# Patient Record
Sex: Female | Born: 1955 | Hispanic: No | Marital: Single | Smoking: Former smoker
Health system: Southern US, Community
[De-identification: ages and names within clinical notes are randomized; demographics above are authoritative.]

## PROBLEM LIST (undated history)

## (undated) HISTORY — PX: TONSILLECTOMY: SUR1361

---

## 2004-02-02 ENCOUNTER — Ambulatory Visit: Payer: Self-pay | Admitting: Internal Medicine

## 2009-09-18 ENCOUNTER — Encounter (INDEPENDENT_AMBULATORY_CARE_PROVIDER_SITE_OTHER): Payer: Self-pay | Admitting: *Deleted

## 2009-09-19 ENCOUNTER — Ambulatory Visit: Payer: Self-pay | Admitting: Internal Medicine

## 2009-10-03 ENCOUNTER — Ambulatory Visit: Payer: Self-pay | Admitting: Internal Medicine

## 2009-10-06 ENCOUNTER — Encounter: Payer: Self-pay | Admitting: Internal Medicine

## 2010-04-03 NOTE — Miscellaneous (Signed)
Summary: DIR COL SCR-AGE...EM  Clinical Lists Changes  Medications: Added new medication of MOVIPREP 100 GM  SOLR (PEG-KCL-NACL-NASULF-NA ASC-C) As per prep instructions. - Signed Rx of MOVIPREP 100 GM  SOLR (PEG-KCL-NACL-NASULF-NA ASC-C) As per prep instructions.;  #1 x 0;  Signed;  Entered by: Sherren Kerns RN;  Authorized by: Iva Boop MD, FACG;  Method used: Print then Give to Patient Allergies: Added new allergy or adverse reaction of SULFA Added new allergy or adverse reaction of PREDNISONE Observations: Added new observation of NKA: F (09/19/2009 13:39)    Prescriptions: MOVIPREP 100 GM  SOLR (PEG-KCL-NACL-NASULF-NA ASC-C) As per prep instructions.  #1 x 0   Entered by:   Sherren Kerns RN   Authorized by:   Iva Boop MD, [facility]   Signed by:   Sherren Kerns RN on 09/19/2009   Method used:   Print then Give to Patient   RxID:   [id number]

## 2010-04-03 NOTE — Letter (Signed)
Summary: Patient Notice-Hyperplastic Polyps  [facility]  [address] [place], [place] [address]   Phone: [phone]  Fax: [phone]        October 06, 2009 MRN: [id number]    [facility] [address] [place], [place]  [address]    Dear Ms. Prabhu,  I am pleased to inform you that the colon polyp removed during your recent colonoscopy was hyperplastic.  These types of polyps are NOT pre-cancerous.  It is therefore my recommendation that you have a repeat colonoscopy examination in 10 years for routine colorectal cancer screening.  Should you develop new or worsening symptoms of abdominal pain, bowel habit changes or bleeding from the rectum or bowels, please schedule an evaluation with either your primary care physician or with me.  Please call us if you are having persistent problems or have questions about your condition that have not been fully answered at this time.  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  Sincerely,  Iva Boop MD, [facility] This letter has been electronically signed by your physician.  Appended Document: Patient Notice-Hyperplastic Polyps letter mailed

## 2010-04-03 NOTE — Procedures (Signed)
Summary: Colonoscopy  Patient: Alexis Santiago Note: All result statuses are Final unless otherwise noted.  Tests: (1) Colonoscopy (COL)   COL Colonoscopy           DONE     [facility]     [address]     [place], [place]  [address]           COLONOSCOPY PROCEDURE REPORT           PATIENT:  [name], [name]  MR#:  [id number]     BIRTHDATE:  18-Oct-1955, 53 yrs. old  GENDER:  female     ENDOSCOPIST:  Iva Boop, MD, [facility]     REF. BY:  Rudi Heap, M.D.     PROCEDURE DATE:  10/03/2009     PROCEDURE:  Colonoscopy with snare polypectomy     ASA CLASS:  Class II     INDICATIONS:  Routine Risk Screening     MEDICATIONS:   Fentanyl 75 mcg IV, Versed 9 mg IV           DESCRIPTION OF PROCEDURE:   After the risks benefits and     alternatives of the procedure were thoroughly explained, informed     consent was obtained.  Digital rectal exam was performed and     revealed no abnormalities.   The LB CF-H180AL [id number] endoscope     was introduced through the anus and advanced to the cecum, which     was identified by both the appendix and ileocecal valve, without     limitations.  The quality of the prep was excellent, using     MoviPrep.  The instrument was then slowly withdrawn as the colon     was fully examined. Insertion: 2:20 minutes Withdrawal: 6:53     minutes     <<PROCEDUREIMAGES>>           FINDINGS:  A sessile polyp was found at the hepatic flexure. It     was 6 mm in size. Polyp was snared without cautery. Retrieval was     successful.  This was otherwise a normal examination of the colon     including retroflexed views in the right colon.   Retroflexed     views in the rectum revealed internal and external hemorrhoids.     The scope was then withdrawn from the patient and the procedure     completed.           COMPLICATIONS:  None     ENDOSCOPIC IMPRESSION:     1) 6 mm sessile polyp at the hepatic flexure - removed     2) Internal and external hemorrhoids     3)  Otherwise normal examination, excellent prep           REPEAT EXAM:  In for Colonoscopy, pending biopsy results.           Iva Boop, MD, [facility]           CC:  Rudi Heap, MD     The Patient           n.     Rosalie Doctor:   Iva Boop at 10/03/2009 03:53 PM           Alexis Santiago, [id number]  Note: An exclamation mark (!) indicates a result that was not dispersed into the flowsheet. Document Creation Date: 10/03/2009 3:53 PM _______________________________________________________________________  (1) Order result status: Final Collection or observation date-time: 10/03/2009 15:42 Requested date-time:  Receipt date-time:  Reported date-time:  Referring Physician:   Ordering Physician: Stan Head [id number]) Specimen Source:  Source: Launa Grill Order Number: [id number] Lab site:   Appended Document: Colonoscopy     Procedures Next Due Date:    Colonoscopy: 10/2019

## 2010-04-03 NOTE — Letter (Signed)
Summary: [facility] Instructions  [facility]  [address] [place], [place] [address]   Phone: [phone]  Fax: [phone]       Alexis Santiago    Jan 07, 1956    MRN: [id number]        Procedure Day /Date:  Tuesday 10/03/2009     Arrival Time: 2:00 pm      Procedure Time: 3:00 pm     Location of Procedure:                    _x _  [facility] (4th Floor)                        PREPARATION FOR COLONOSCOPY WITH MOVIPREP   Starting 5 days prior to your procedure Friday 7/28 do not eat nuts, seeds, popcorn, corn, beans, peas,  salads, or any raw vegetables.  Do not take any fiber supplements (e.g. Metamucil, Citrucel, and Benefiber).  THE DAY BEFORE YOUR PROCEDURE         DATE: Monday 8/1  1.  Drink clear liquids the entire day-NO SOLID FOOD  2.  Do not drink anything colored red or purple.  Avoid juices with pulp.  No orange juice.  3.  Drink at least 64 oz. (8 glasses) of fluid/clear liquids during the day to prevent dehydration and help the prep work efficiently.  CLEAR LIQUIDS INCLUDE: Water Jello Ice Popsicles Tea (sugar ok, no milk/cream) Powdered fruit flavored drinks Coffee (sugar ok, no milk/cream) Gatorade Juice: apple, white grape, white cranberry  Lemonade Clear bullion, consomm, broth Carbonated beverages (any kind) Strained chicken noodle soup Hard Candy                             4.  In the morning, mix first dose of MoviPrep solution:    Empty 1 Pouch A and 1 Pouch B into the disposable container    Add lukewarm drinking water to the top line of the container. Mix to dissolve    Refrigerate (mixed solution should be used within 24 hrs)  5.  Begin drinking the prep at 5:00 p.m. The MoviPrep container is divided by 4 marks.   Every 15 minutes drink the solution down to the next mark (approximately 8 oz) until the full liter is complete.   6.  Follow completed prep with 16 oz of clear liquid of your choice (Nothing red or  purple).  Continue to drink clear liquids until bedtime.  7.  Before going to bed, mix second dose of MoviPrep solution:    Empty 1 Pouch A and 1 Pouch B into the disposable container    Add lukewarm drinking water to the top line of the container. Mix to dissolve    Refrigerate  THE DAY OF YOUR PROCEDURE      DATE: Tuesday 8/2  Beginning at 10:00 a.m. (5 hours before procedure):         1. Every 15 minutes, drink the solution down to the next mark (approx 8 oz) until the full liter is complete.  2. Follow completed prep with 16 oz. of clear liquid of your choice.    3. You may drink clear liquids until 1:00 pm (2 HOURS BEFORE PROCEDURE).   MEDICATION INSTRUCTIONS  Unless otherwise instructed, you should take regular prescription medications with a small sip of water   as early as possible the morning of  your procedure.    Additional medication instructions: n/a         OTHER INSTRUCTIONS  You will need a responsible adult at least 53 years of age to accompany you and drive you home.   This person must remain in the waiting room during your procedure.  Wear loose fitting clothing that is easily removed.  Leave jewelry and other valuables at home.  However, you may wish to bring a book to read or  an iPod/MP3 player to listen to music as you wait for your procedure to start.  Remove all body piercing jewelry and leave at home.  Total time from sign-in until discharge is approximately 2-3 hours.  You should go home directly after your procedure and rest.  You can resume normal activities the  day after your procedure.  The day of your procedure you should not:   Drive   Make legal decisions   Operate machinery   Drink alcohol   Return to work  You will receive specific instructions about eating, activities and medications before you leave.    The above instructions have been reviewed and explained to me by  Sherren Kerns RN  September 19, 2009 2:07 PM       I fully understand and can verbalize these instructions _____________________________ Date _________

## 2012-07-15 ENCOUNTER — Ambulatory Visit (INDEPENDENT_AMBULATORY_CARE_PROVIDER_SITE_OTHER): Payer: BC Managed Care – PPO

## 2012-07-15 ENCOUNTER — Ambulatory Visit (INDEPENDENT_AMBULATORY_CARE_PROVIDER_SITE_OTHER): Payer: BC Managed Care – PPO | Admitting: Family Medicine

## 2012-07-15 ENCOUNTER — Encounter: Payer: Self-pay | Admitting: Family Medicine

## 2012-07-15 VITALS — BP 148/72 | HR 73 | Temp 97.8°F | Ht 66.0 in | Wt 255.6 lb

## 2012-07-15 DIAGNOSIS — R05 Cough: Secondary | ICD-10-CM

## 2012-07-15 DIAGNOSIS — R059 Cough, unspecified: Secondary | ICD-10-CM

## 2012-07-15 LAB — POCT CBC
HCT, POC: 40.6 % (ref 37.7–47.9)
Hemoglobin: 12.8 g/dL (ref 12.2–16.2)
MCHC: 31.5 g/dL — AB (ref 31.8–35.4)
POC Granulocyte: 5.3 (ref 2–6.9)

## 2012-07-15 MED ORDER — BUDESONIDE-FORMOTEROL FUMARATE 160-4.5 MCG/ACT IN AERO: 2.0000 | INHALATION_SPRAY | Freq: Two times a day (BID) | RESPIRATORY_TRACT | Status: DC

## 2012-07-15 MED ORDER — METHYLPREDNISOLONE ACETATE 80 MG/ML IJ SUSP
60.0000 mg | Freq: Once | INTRAMUSCULAR | Status: AC
  Administered 2012-07-15: 60 mg via INTRAMUSCULAR

## 2012-07-15 MED ORDER — FLUTICASONE PROPIONATE 50 MCG/ACT NA SUSP: 2.0000 | Freq: Every day | NASAL | Status: DC

## 2012-07-15 NOTE — Progress Notes (Signed)
  Subjective:    Patient ID: Alexis Santiago, female    DOB: 15-Jun-1955, 57 y.o.   MRN: [id number]  HPI Patient has noticed this off and on cough for the past 2 months. It's not productive and in the sputum that is produced is thick and white in color. She has noticed occasional wheezing. The cough and wheezing experiences completely new to her, sometimes to the point of losing her breath completely. There has been no fever or or head congestion. 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   Review of Systems  Constitutional: Negative for fever.  Respiratory: Positive for cough (persistent, started in March).        Objective:   Physical Exam  Vitals reviewed. Constitutional: She is oriented to person, place, and time. She appears well-developed and well-nourished. She appears distressed.  HENT:  Head: Normocephalic and atraumatic.  Right Ear: External ear normal.  Left Ear: External ear normal.  Mouth/Throat: Oropharynx is clear and moist. No oropharyngeal exudate.  Eyes: Conjunctivae are normal. Right eye exhibits no discharge. Left eye exhibits no discharge.  Neck: Normal range of motion. Neck supple. No thyromegaly present.  Cardiovascular: Normal rate and regular rhythm.  Exam reveals no gallop and no friction rub.   No murmur heard. 72 per minute  Pulmonary/Chest: Effort normal and breath sounds normal. No respiratory distress. She has no wheezes. She has no rales.  Dry cough  Neurological: She is alert and oriented to person, place, and time.  Skin: Skin is warm and dry. No rash noted. No erythema.  Psychiatric: She has a normal mood and affect. Her behavior is normal. Judgment and thought content normal.    [facility] reading (PRIMARY) by  Dr. Christell Constant: CXR:  Within normal limits                                       Assessment & Plan:  1. Cough - DG Chest 2 View - POCT CBC  2. Symbicort 160/4.5   2 puffs twice daily, rinse mouth after using     Flonase 1-2  sprays each nostril at bedtime     Depo-Medrol 60 mg IM   Patient Instructions  Drink lots of fluid Take and use medication as directed Mucinex maximum strength, blue and white in color, 1 twice daily with a large glass of water

## 2012-07-15 NOTE — Patient Instructions (Addendum)
Drink lots of fluid Take and use medication as directed Mucinex maximum strength, blue and white in color, 1 twice daily with a large glass of water

## 2012-07-20 ENCOUNTER — Telehealth: Payer: Self-pay | Admitting: Family Medicine

## 2012-07-20 NOTE — Telephone Encounter (Signed)
Please advise 

## 2012-08-13 ENCOUNTER — Ambulatory Visit (INDEPENDENT_AMBULATORY_CARE_PROVIDER_SITE_OTHER): Payer: BC Managed Care – PPO | Admitting: Family Medicine

## 2012-08-13 ENCOUNTER — Encounter: Payer: Self-pay | Admitting: Family Medicine

## 2012-08-13 VITALS — BP 144/80 | HR 87 | Temp 97.8°F | Wt 260.4 lb

## 2012-08-13 DIAGNOSIS — L723 Sebaceous cyst: Secondary | ICD-10-CM

## 2012-08-13 DIAGNOSIS — L720 Epidermal cyst: Secondary | ICD-10-CM

## 2012-08-13 DIAGNOSIS — J209 Acute bronchitis, unspecified: Secondary | ICD-10-CM

## 2012-08-13 DIAGNOSIS — J309 Allergic rhinitis, unspecified: Secondary | ICD-10-CM

## 2012-08-13 NOTE — Patient Instructions (Signed)
Use Mucinex twice daily as needed for cough. Drink plenty of fluids they're not irritating to the esophagus. Really ty to use the Flonase nose spray on a more regular basis. If didn't with reflux issues use Prevacid or Prilosec once daily before breakfast or Zantac one twice daily before breakfast and supper. He may try this regimen for a couple weeks and see if it helps any of your cough and reflux problems. An appointment for one provides to see about the epidermal cyst on his scalp appear

## 2012-08-13 NOTE — Progress Notes (Signed)
  Subjective:    Patient ID: Alexis Santiago, female    DOB: [birth year], 57 y.o.   MRN: [id number]  HPI  Patient returns to clinic for followup of cough and bronchospasm. He is doing better and is not using the inhaler or the Flonase. She still has minimal cough. She attributes some of her coughing possibly due to intake of beverages that it irritated her esophagus and to eating nutsthat were not cooked. X-rays and meds were reviewed from the previous visit.   Review of Systems  Respiratory: Positive for cough (improved, still slightly prod).        Objective:   Physical Exam  Vitals reviewed. Constitutional: She is oriented to person, place, and time. She appears well-developed and well-nourished.  HENT:  Head: Normocephalic and atraumatic.  Right Ear: External ear normal.  Left Ear: External ear normal.   nasal congestion bilaterally  Eyes: Conjunctivae are normal. Right eye exhibits no discharge. Left eye exhibits no discharge.  Neck: Normal range of motion. Neck supple. No thyromegaly present.  Cardiovascular: Normal rate and regular rhythm.   No murmur heard. Pulmonary/Chest: Effort normal and breath sounds normal. No respiratory distress. She has no wheezes. She has no rales.  Lymphadenopathy:    She has no cervical adenopathy.  Neurological: She is alert and oriented to person, place, and time.  Skin: Skin is warm and dry.  Large epididymal cysts superior occipital scalp  Psychiatric: She has a normal mood and affect. Judgment and thought content normal.          Assessment & Plan:  1. Acute bronchitis Use Mucinex as needed for cough   2. Allergic rhinitis -Use Flonase regularly  3. Epidermal cyst -Appointment with provider for excision  Patient Instructions  Use Mucinex twice daily as needed for cough. Drink plenty of fluids they're not irritating to the esophagus. Really ty to use the Flonase nose spray on a more regular basis. If didn't with reflux issues  use Prevacid or Prilosec once daily before breakfast or Zantac one twice daily before breakfast and supper. He may try this regimen for a couple weeks and see if it helps any of your cough and reflux problems. An appointment for one provides to see about the epidermal cyst on his scalp appear

## 2012-08-28 ENCOUNTER — Ambulatory Visit (INDEPENDENT_AMBULATORY_CARE_PROVIDER_SITE_OTHER): Payer: BC Managed Care – PPO | Admitting: Physician Assistant

## 2012-08-28 ENCOUNTER — Encounter: Payer: Self-pay | Admitting: Physician Assistant

## 2012-08-28 VITALS — BP 142/83 | HR 75 | Temp 98.0°F | Ht 66.0 in | Wt 254.0 lb

## 2012-08-28 DIAGNOSIS — L723 Sebaceous cyst: Secondary | ICD-10-CM

## 2012-08-28 NOTE — Progress Notes (Signed)
Subjective:     Patient ID: Alexis Santiago, female   DOB: 06-08-1955, 57 y.o.   MRN: [id number]  HPI Pt with painful cyst to the superior scalp She has had hx of same and prior excision of the same site mult times + increasing size and pain to the site Desires excision Denies any drainage from the site  Review of Systems  All other systems reviewed and are negative.       Objective:   Physical Exam  Nursing note and vitals reviewed. Pt with .3x.3 cm cystic are to the superior scalp Minimal TTP No drainage Offered excision- pt aware of possible reoccurrence Consent obtained Area cleansed with betadine Anesth with 1 cc lido with epi Under sterile techniq small incision made Cystic material removed Area cleansed again Packing placed  Dressing placed     Assessment:     1. Sebaceous cyst        Plan:    Wound care reviewed Progress packing daily Return immed if any S/S of infection

## 2012-08-28 NOTE — Patient Instructions (Signed)
Wound Care Wound care helps prevent pain and infection.  You may need a tetanus shot if:  You cannot remember when you had your last tetanus shot.  You have never had a tetanus shot.  The injury broke your skin. If you need a tetanus shot and you choose not to have one, you may get tetanus. Sickness from tetanus can be serious. HOME CARE   Only take medicine as told by your doctor.  Clean the wound daily with mild soap and water.  Change any bandages (dressings) as told by your doctor.  Put medicated cream and a bandage on the wound as told by your doctor.  Change the bandage if it gets wet, dirty, or starts to smell.  Take showers. Do not take baths, swim, or do anything that puts your wound under water.  Rest and raise (elevate) the wound until the pain and puffiness (swelling) are better.  Keep all doctor visits as told. GET HELP RIGHT AWAY IF:   Yellowish-white fluid (pus) comes from the wound.  Medicine does not lessen your pain.  There is a red streak going away from the wound.  You have a fever. MAKE SURE YOU:   Understand these instructions.  Will watch your condition.  Will get help right away if you are not doing well or get worse. Document Released: 11/28/2007 Document Revised: 05/13/2011 Document Reviewed: 06/24/2010 [employer] Patient Information 2014 [employer].  

## 2013-04-12 ENCOUNTER — Telehealth: Payer: Self-pay | Admitting: Family Medicine

## 2013-04-12 NOTE — Telephone Encounter (Signed)
appt with dwm at 2;30

## 2013-04-13 ENCOUNTER — Encounter: Payer: Self-pay | Admitting: Family Medicine

## 2013-04-13 ENCOUNTER — Ambulatory Visit (INDEPENDENT_AMBULATORY_CARE_PROVIDER_SITE_OTHER): Payer: BC Managed Care – PPO | Admitting: Family Medicine

## 2013-04-13 VITALS — BP 138/77 | HR 81 | Temp 97.6°F | Ht 66.0 in | Wt 250.0 lb

## 2013-04-13 DIAGNOSIS — H60549 Acute eczematoid otitis externa, unspecified ear: Secondary | ICD-10-CM

## 2013-04-13 DIAGNOSIS — L853 Xerosis cutis: Secondary | ICD-10-CM

## 2013-04-13 DIAGNOSIS — L738 Other specified follicular disorders: Secondary | ICD-10-CM

## 2013-04-13 DIAGNOSIS — H60509 Unspecified acute noninfective otitis externa, unspecified ear: Secondary | ICD-10-CM

## 2013-04-13 NOTE — Patient Instructions (Signed)
Shield the ear canals from the hair dryer Use a scent free shampoo Use cortisone 10 on a Q-tip to 3 times weekly in ear canal Try to keep as much water at of the ear canals as possible

## 2013-04-13 NOTE — Progress Notes (Signed)
   Subjective:    Patient ID: Alexis Santiago, female    DOB: Jul 07, 1955, 58 y.o.   MRN: [id number]  HPI Patient here today for ear itching and pain, right ear is worse. There is discharge and there are no other complaints       There are no active problems to display for this patient.  No outpatient encounter prescriptions on file as of 04/13/2013.    Review of Systems  Constitutional: Negative.   HENT: Positive for ear discharge and ear pain.   Eyes: Negative.   Respiratory: Negative.   Cardiovascular: Negative.   Gastrointestinal: Negative.   Endocrine: Negative.   Genitourinary: Negative.   Musculoskeletal: Negative.   Skin: Negative.   Allergic/Immunologic: Negative.   Neurological: Negative.   Hematological: Negative.   Psychiatric/Behavioral: Negative.        Objective:   Physical Exam  Nursing note and vitals reviewed. Constitutional: She is oriented to person, place, and time. She appears well-developed and well-nourished. No distress.  HENT:  Head: Normocephalic and atraumatic.  Right Ear: External ear normal.  Left Ear: External ear normal.  Nose: Nose normal.  Mouth/Throat: Oropharynx is clear and moist.  Specifically ear canals were normal and TMs were normal with no ear canal cellulitis or edema or erythema.  Eyes: Conjunctivae and EOM are normal. Pupils are equal, round, and reactive to light. Right eye exhibits no discharge. Left eye exhibits no discharge. No scleral icterus.  Neck: Normal range of motion. Neck supple. No thyromegaly present.  Cardiovascular: Normal rate, regular rhythm and normal heart sounds.   No murmur heard. Pulmonary/Chest: Effort normal and breath sounds normal. No respiratory distress. She has no rales. She exhibits no tenderness.  Musculoskeletal: Normal range of motion. She exhibits edema (right ankle).  Neurological: She is alert and oriented to person, place, and time.  Skin: Skin is warm and dry. No rash noted.  Very dry  skin on hands  Psychiatric: She has a normal mood and affect. Her behavior is normal. Judgment normal.   BP 138/77  Pulse 81  Temp(Src) 97.6 F (36.4 C) (Oral)  Ht 5\' 6"  (1.676 m)  Wt 250 lb (113.399 kg)  BMI 40.37 kg/m2        Assessment & Plan:  Dermatitis of ear canal  Dry skin  Patient Instructions  Shield the ear canals from the hair dryer Use a scent free shampoo Use cortisone 10 on a Q-tip to 3 times weekly in ear canal Try to keep as much water at of the ear canals as possible   Nyra Capeson W. Moore MD

## 2014-06-13 ENCOUNTER — Telehealth: Payer: Self-pay | Admitting: Family Medicine

## 2015-05-10 IMAGING — CR DG CHEST 2V
2 series · 2 of 2 positions shown · non-contrast
Comparison: None.

CLINICAL DATA: Persistent cough

CHEST - 2 VIEW

[view not recorded (1 of 2)]
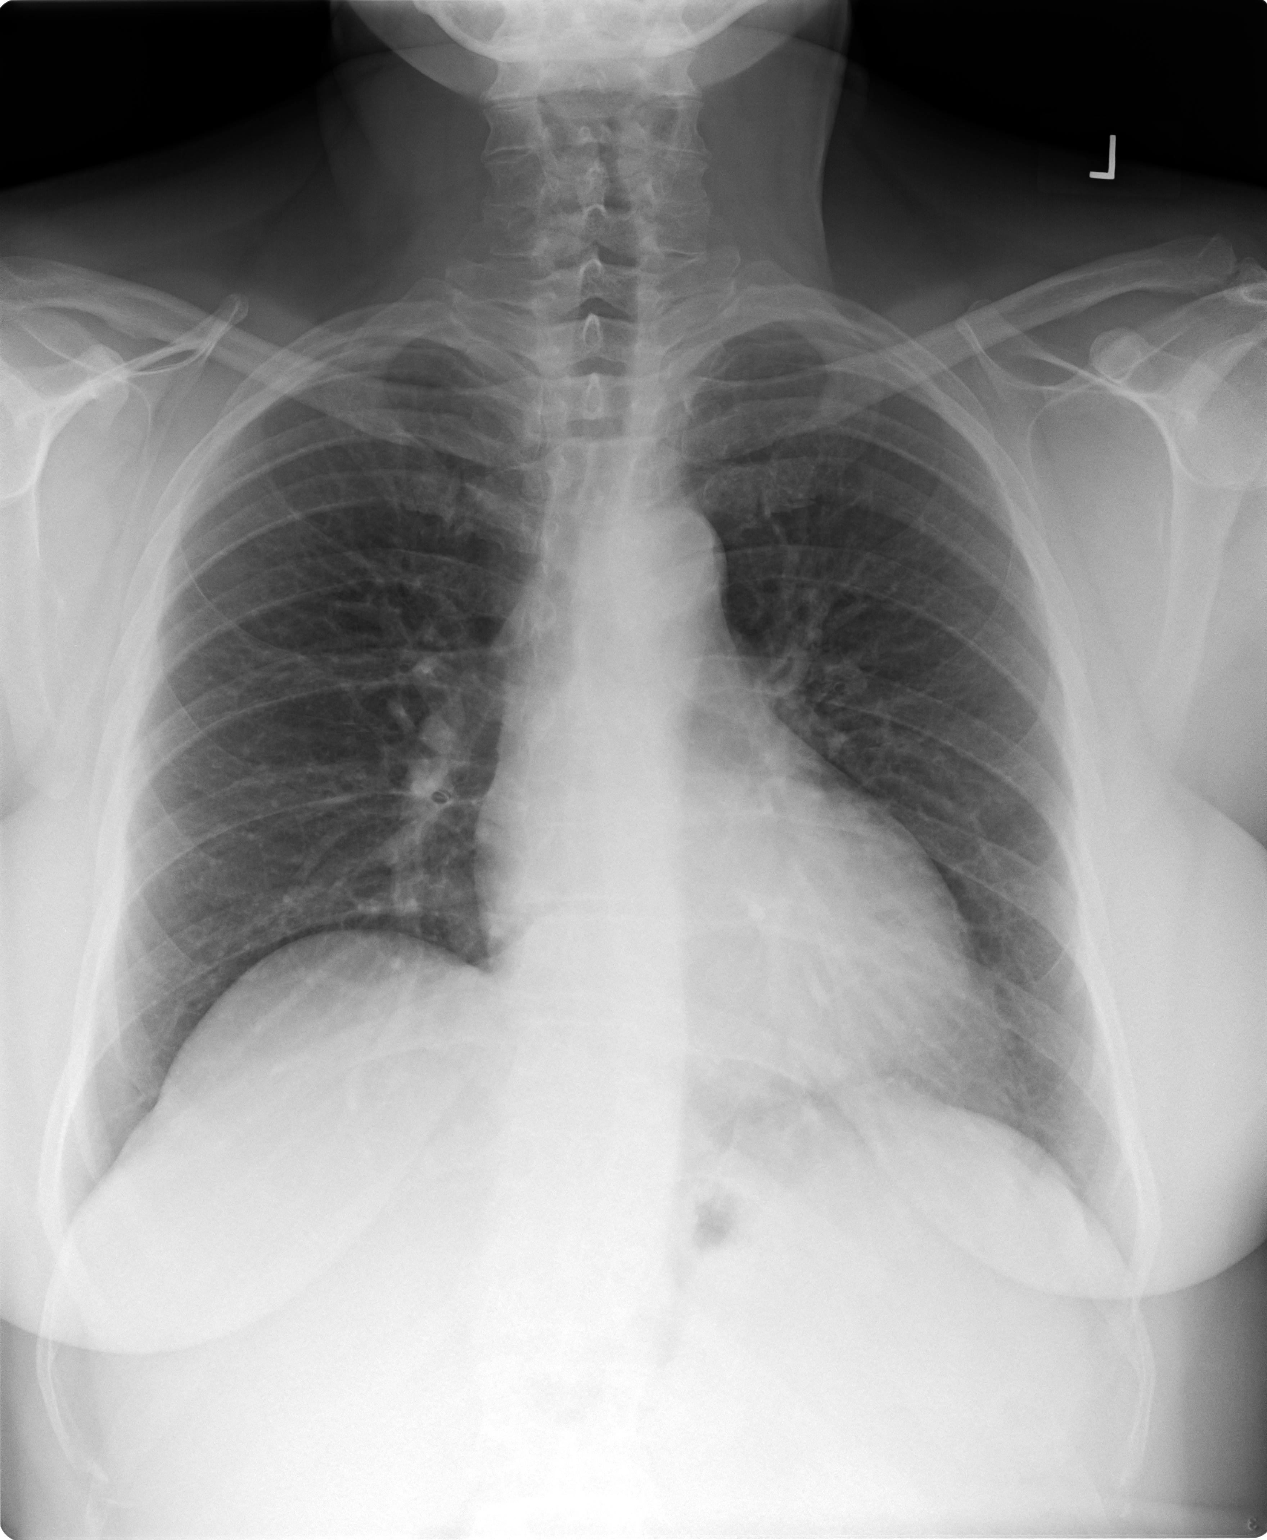

[view not recorded (2 of 2)]
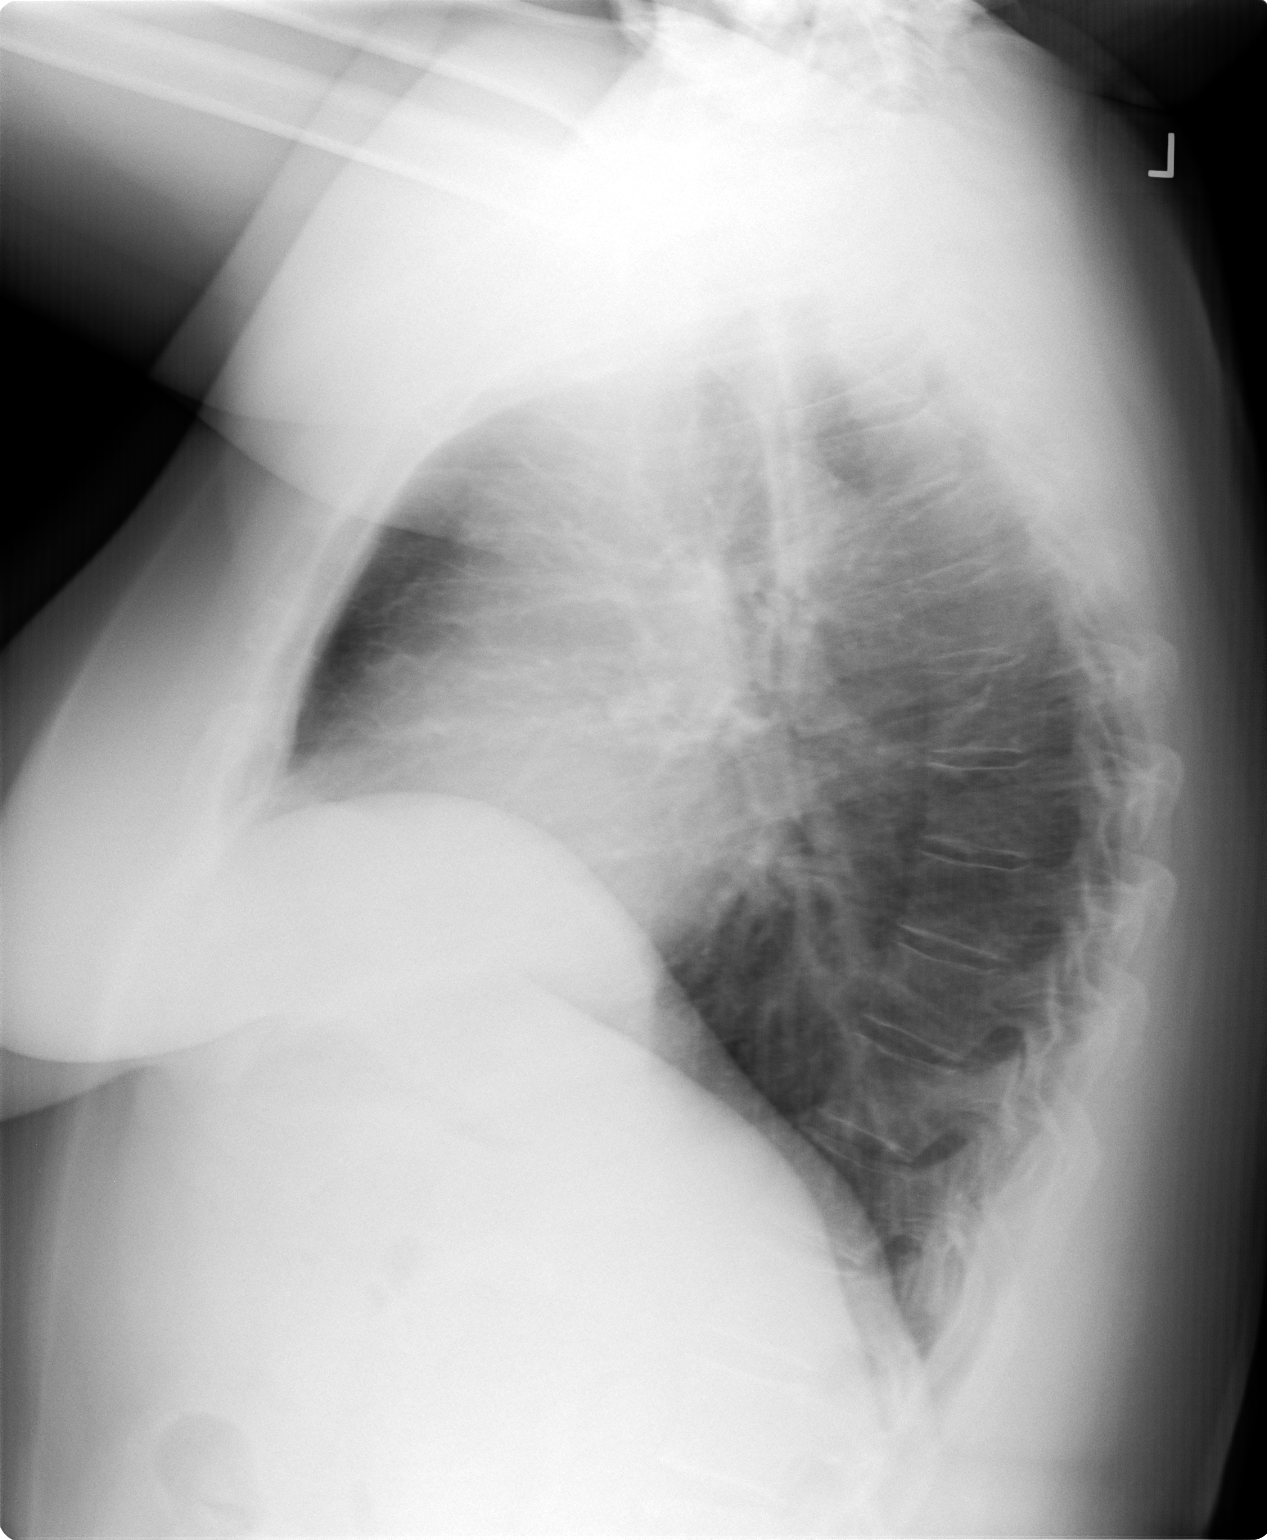

[2 of 2 positions shown; findings below may reference images not displayed]

FINDINGS: Cardiomediastinal silhouette is unremarkable.  Mild
elevation of the right hemidiaphragm.  No acute infiltrate or
pleural effusion.  No pulmonary edema.
IMPRESSION: No active disease.  Mild elevation of the right hemidiaphragm.

## 2015-07-03 DEATH — deceased
